# Patient Record
Sex: Male | Born: 1975 | Race: White | Hispanic: No | Marital: Single | Smoking: Former smoker
Health system: Southern US, Community
[De-identification: ages and names within clinical notes are randomized; demographics above are authoritative.]

## PROBLEM LIST (undated history)

## (undated) DIAGNOSIS — J45909 Unspecified asthma, uncomplicated: Secondary | ICD-10-CM

## (undated) DIAGNOSIS — T7840XA Allergy, unspecified, initial encounter: Secondary | ICD-10-CM

## (undated) HISTORY — DX: Allergy, unspecified, initial encounter: T78.40XA

## (undated) HISTORY — DX: Unspecified asthma, uncomplicated: J45.909

## (undated) HISTORY — PX: HERNIA REPAIR: SHX51

---

## 2008-03-27 ENCOUNTER — Ambulatory Visit (HOSPITAL_BASED_OUTPATIENT_CLINIC_OR_DEPARTMENT_OTHER): Admission: RE | Admit: 2008-03-27 | Discharge: 2008-03-27 | Payer: Self-pay | Admitting: General Surgery

## 2010-12-10 NOTE — Op Note (Signed)
[name], Joshua Obrien             ACCOUNT NO.:  [id number]   MEDICAL RECORD NO.:  [id number]          PATIENT TYPE:  AMB   LOCATION:  DSC                          FACILITY:  [facility]   PHYSICIAN:  Cherylynn Ridges, M.D.    DATE OF BIRTH:  1975/12/01   DATE OF PROCEDURE:  03/27/2008  DATE OF DISCHARGE:                               OPERATIVE REPORT   PREOPERATIVE DIAGNOSIS:  Right inguinal hernia.   POSTOPERATIVE DIAGNOSIS:  Direct right inguinal hernia.   PROCEDURE:  Right inguinal hernia repair with mesh.   SURGEON:  Marta Lamas. Lindie Spruce, MD   ANESTHESIA:  General endotracheal.   ESTIMATED BLOOD LOSS:  Less than 20 mL.   COMPLICATIONS:  None.   CONDITION:  Stable.   FINDINGS:  Large direct inguinal hernia.  No indirect component.   INDICATIONS FOR OPERATION:  The patient is a 35 year old who comes in  with a symptomatic right inguinal hernia.   OPERATION:  The patient was taken to the operating room and placed on  table in a supine position.  After an adequate general endotracheal  anesthetic was administered, he was prepped and draped in the usual  sterile manner exposing the right inguinal area.   A 6-cm long transverse curvilinear incision was made at the level of  superficial ring and taken down into the subcutaneous tissue.  We  subsequently dissected down to the external oblique fascia at the level  of the superficial ring exposing the ring itself.  We opened the  external oblique fascia along its fibers down through the superficial  ring exposing and then mobilized the spermatic cord at the pubic  tubercle.  We encircled the spermatic cord with a Penrose drain and we  could immediately see a large direct component coming out medial to the  cord.  We separated the cord from the hernia sac itself, then as we  retracted the spermatic cord laterally we dissected out the hernia sac  medially down to its base.  We inspected the spermatic cord for an  indirect sac and none was  noted towards the internal ring.   We then imbricated the hernia sac on itself using interrupted 0 Ethibond  sutures.  We then placed an oval piece of mesh measuring 3 x 5 cm in  size tacking it to the conjoined tendon anteromedially and the reflected  portion on the inguinal ligament inferolaterally.  This was done with  running 0 Prolene sutures.  Once this was done, we irrigated with  antibiotic solution in which the mesh had been soaked prior to  implantation.  We allowed the spermatic cord to fall back into the canal  and then closed the external oblique fascia on top of the cord using  running 3-0 Vicryl suture.  Marcaine 0.5% without epinephrine was  injected to the wound with total of 19 mL were used.  We then  closed Scarpa fascia using interrupted 3-0 Vicryl and then the skin was  closed using a running subcuticular stitch of 4-0 Monocryl.  Steri-  Strips, Dermabond, and Tegaderm were applied as final dressing.  All  counts  were correct including needles, sponges, and instruments.      Cherylynn Ridges, M.D.  Electronically Signed     JOW/MEDQ  D:  03/27/2008  T:  03/28/2008  Job:  [id number]   cc:   Primary Care Physician

## 2012-06-17 ENCOUNTER — Ambulatory Visit (INDEPENDENT_AMBULATORY_CARE_PROVIDER_SITE_OTHER): Payer: BC Managed Care – PPO | Admitting: Family Medicine

## 2012-06-17 VITALS — BP 123/87 | HR 81 | Temp 97.8°F | Resp 17 | Ht 72.0 in | Wt 217.0 lb

## 2012-06-17 DIAGNOSIS — R1031 Right lower quadrant pain: Secondary | ICD-10-CM

## 2012-06-17 DIAGNOSIS — IMO0001 Reserved for inherently not codable concepts without codable children: Secondary | ICD-10-CM

## 2012-06-17 DIAGNOSIS — R590 Localized enlarged lymph nodes: Secondary | ICD-10-CM

## 2012-06-17 DIAGNOSIS — R599 Enlarged lymph nodes, unspecified: Secondary | ICD-10-CM

## 2012-06-17 LAB — POCT UA - MICROSCOPIC ONLY: Crystals, Ur, HPF, POC: NEGATIVE

## 2012-06-17 LAB — POCT URINALYSIS DIPSTICK
Blood, UA: NEGATIVE
Glucose, UA: NEGATIVE
Nitrite, UA: NEGATIVE
Urobilinogen, UA: 0.2

## 2012-06-17 MED ORDER — AMOXICILLIN-POT CLAVULANATE 875-125 MG PO TABS: 1.0000 | ORAL_TABLET | Freq: Two times a day (BID) | ORAL | Status: DC

## 2012-06-17 NOTE — Progress Notes (Signed)
[address]   [place], [place]  [address]   [phone]  Subjective:    Patient ID: Joshua Obrien, male    DOB: May 02, 1976, 36 y.o.   MRN: [id number]  HPIThis 36 y.o. male presents for evaluation of R groin pain.  Awoke yesterday with sharp pain in R groin.  Feels swollen lymph node.  Pain is constant discomfort.  No skin lesions/scratches.  No fever/chills/sweats.   No myalgias.  R knee hurting today; no swelling; no radness.  No bites; small cat scratches which is normal.  No genital lesions.  No dysuria; no penile lesions.  No genital rashes.   No night sweats.  No tick bites.  Bartender.    Total partners =30+; no STDs.  Last STD check one year ago.   Old rash R medial calf present x 2 weeks.   PCP:  Deboraha Sprang [facility]     Review of Systems  Constitutional: Negative for fever, chills, diaphoresis, activity change and fatigue.  Gastrointestinal: Negative for nausea, vomiting, abdominal pain and diarrhea.  Genitourinary: Negative for dysuria, urgency, frequency, hematuria, flank pain, discharge, penile swelling, scrotal swelling, genital sores, penile pain and testicular pain.  Musculoskeletal: Negative for myalgias.  Skin: Positive for rash and wound. Negative for color change and pallor.        Past Medical History  Diagnosis Date  . Allergy   . Asthma     Past Surgical History  Procedure Date  . Hernia repair     Right    Prior to Admission medications   Medication Sig Start Date End Date Taking? Authorizing Provider  albuterol (PROVENTIL) (2.5 MG/3ML) 0.083% nebulizer solution Take 2.5 mg by nebulization every 6 (six) hours as needed.   Yes Historical Provider, MD  fluticasone-salmeterol (ADVAIR HFA) 230-21 MCG/ACT inhaler Inhale 2 puffs into the lungs 2 (two) times daily.   Yes Historical Provider, MD  amoxicillin-clavulanate (AUGMENTIN) 875-125 MG per tablet Take 1 tablet by mouth 2 (two) times daily. 06/17/12   Ethelda Chick, MD    Allergies  Allergen Reactions  .  Keflex (Cephalexin) Itching    History   Social History  . Marital Status: Single    Spouse Name: N/A    Number of Children: N/A  . Years of Education: N/A   Occupational History  . Not on file.   Social History Main Topics  . Smoking status: Current Every Day Smoker -- 1.0 packs/day for 8 years    Types: Cigarettes  . Smokeless tobacco: Not on file  . Alcohol Use: 2.4 oz/week    4 Cans of beer per week  . Drug Use: No  . Sexually Active: Yes    Birth Control/ Protection: None   Other Topics Concern  . Not on file   Social History Narrative   Marital status: single; not dating   Lives: alone  Children: none   Employment: bartender    Family History  Problem Relation Age of Onset  . Diabetes Mother   . Cirrhosis Mother   . Hemochromatosis Father     Objective:   Physical Exam  Nursing note and vitals reviewed. Constitutional: He is oriented to person, place, and time. He appears well-developed and well-nourished. No distress.  Cardiovascular: Normal rate, regular rhythm and intact distal pulses.  Exam reveals no gallop and no friction rub.   No murmur heard. Pulmonary/Chest: Effort normal and breath sounds normal. He has no wheezes. He has no rales.  Abdominal: Soft. Bowel sounds are  normal. There is no tenderness. There is no rebound and no guarding. Hernia confirmed negative in the right inguinal area and confirmed negative in the left inguinal area.  Genitourinary: Penis normal. Right testis shows no mass, no swelling and no tenderness. Left testis shows no mass, no swelling and no tenderness.  Musculoskeletal:       Right knee: He exhibits normal range of motion, no swelling, no effusion and no erythema. no tenderness found. No medial joint line, no lateral joint line and no patellar tendon tenderness noted.  Lymphadenopathy:       Right: Inguinal adenopathy present.  Neurological: He is alert and oriented to person, place, and time.  Skin: He is not  diaphoretic.       Scattered linear scratches distal calf RLE without erythema, drainage, induration.  Psychiatric: He has a normal mood and affect. His behavior is normal. Thought content normal.        Results for orders placed in visit on 06/17/12  POCT URINALYSIS DIPSTICK      Component Value Range   Color, UA amber     Clarity, UA clear     Glucose, UA neg     Bilirubin, UA neg     Ketones, UA neg     Spec Grav, UA 1.025     Blood, UA neg     pH, UA 6.5     Protein, UA neg     Urobilinogen, UA 0.2     Nitrite, UA neg     Leukocytes, UA Negative    POCT UA - MICROSCOPIC ONLY      Component Value Range   WBC, Ur, HPF, POC 2-4     RBC, urine, microscopic 2-4     Bacteria, U Microscopic trace     Mucus, UA small     Epithelial cells, urine per micros 1-3     Crystals, Ur, HPF, POC neg     Casts, Ur, LPF, POC neg     Yeast, UA neg      Assessment & Plan:   1. Groin pain, right lower quadrant  POCT urinalysis dipstick, POCT UA - Microscopic Only, Urine culture, HIV antibody, GC/chlamydia probe amp, urine, RPR  2. Lymphadenopathy, inguinal  amoxicillin-clavulanate (AUGMENTIN) 875-125 MG per tablet    1. R groin pain: New.  Secondary to LAD.  Supportive care with rest, fluids, Ibuprofen.  RTC if no improvement in 2-3 weeks. 2. R inguinal LAD:  New.  Obtain urine for GC/Chlam, urine culture, HIV, RPR.  Treat empirically with Augmentin 875mg  bid.  RTC if not completely resolved in 2-3 weeks.  Meds ordered this encounter  Medications  . fluticasone-salmeterol (ADVAIR HFA) 230-21 MCG/ACT inhaler    Sig: Inhale 2 puffs into the lungs 2 (two) times daily.  Marland Kitchen albuterol (PROVENTIL) (2.5 MG/3ML) 0.083% nebulizer solution    Sig: Take 2.5 mg by nebulization every 6 (six) hours as needed.  Marland Kitchen amoxicillin-clavulanate (AUGMENTIN) 875-125 MG per tablet    Sig: Take 1 tablet by mouth 2 (two) times daily.    Dispense:  20 tablet    Refill:  0

## 2012-06-17 NOTE — Patient Instructions (Addendum)
1. Groin pain, right lower quadrant  POCT urinalysis dipstick, POCT UA - Microscopic Only, Urine culture, HIV antibody, GC/chlamydia probe amp, urine, RPR  2. Lymphadenopathy, inguinal  amoxicillin-clavulanate (AUGMENTIN) 875-125 MG per tablet      RETURN IN THREE WEEKS IF NO IMPROVEMENT.

## 2012-06-19 LAB — URINE CULTURE
Colony Count: NO GROWTH
Organism ID, Bacteria: NO GROWTH

## 2012-06-19 LAB — GC/CHLAMYDIA PROBE AMP, URINE: GC Probe Amp, Urine: NEGATIVE

## 2014-02-12 ENCOUNTER — Ambulatory Visit (INDEPENDENT_AMBULATORY_CARE_PROVIDER_SITE_OTHER): Payer: BC Managed Care – PPO | Admitting: Family Medicine

## 2014-02-12 VITALS — BP 110/80 | HR 71 | Temp 97.8°F | Resp 20 | Ht 71.5 in | Wt 219.2 lb

## 2014-02-12 DIAGNOSIS — M545 Low back pain, unspecified: Secondary | ICD-10-CM

## 2014-02-12 MED ORDER — PREDNISONE 20 MG PO TABS: ORAL_TABLET | ORAL | Status: DC

## 2014-02-12 MED ORDER — TRAMADOL HCL 50 MG PO TABS: 50.0000 mg | ORAL_TABLET | Freq: Three times a day (TID) | ORAL | Status: DC | PRN

## 2014-02-12 NOTE — Progress Notes (Signed)
This chart was scribed for Elvina SidleKurt Lauenstein, MD by Ardelia Memsylan Malpass, Scribe. This patient was seen in room 10 and the patient's care was started at 1:51 PM.  @UMFCLOGO @  Patient ID: Joshua Obrien MRN: [id number], DOB: [birth year], 38 y.o. Date of Encounter: 02/12/2014, 1:58 PM  Primary Physician: No primary provider on file.  Chief Complaint: Back Pain  HPI: 38 y.o. year old male with history below presents with gradually worsening lower back pain onset 3 days ago. He states that his pain began Friday morning as soreness, and gradually worsened throughout the day. He states that over the past 2 days his pain has felt like "tightness". He suspects that he pulled a muscle in his back on Thursday, although he does not recall any specific injury. He states that he has been taking Ibuprofen with some relief. He states that he had difficulty sleeping last night due to pain. He reports that he has had similar back pain on one occasion in the past. He reports that he does not want any strong narcotics or muscle relaxants.    Past Medical History  Diagnosis Date   Allergy    Asthma      Home Meds: Prior to Admission medications   Medication Sig Start Date End Date Taking? Authorizing Provider  albuterol (PROVENTIL) (2.5 MG/3ML) 0.083% nebulizer solution Take 2.5 mg by nebulization every 6 (six) hours as needed.   Yes Historical Provider, MD  fluticasone-salmeterol (ADVAIR HFA) 230-21 MCG/ACT inhaler Inhale 2 puffs into the lungs 2 (two) times daily.   Yes Historical Provider, MD    Allergies:  Allergies  Allergen Reactions   Keflex [Cephalexin] Itching    History   Social History   Marital Status: Single    Spouse Name: N/A    Number of Children: N/A   Years of Education: N/A   Occupational History   Not on file.   Social History Main Topics   Smoking status: Current Every Day Smoker -- 1.00 packs/day for 8 years    Types: Cigarettes   Smokeless tobacco: Not on file    Alcohol Use: 2.4 oz/week    4 Cans of beer per week   Drug Use: No   Sexual Activity: Yes    CopyBirth Control/ Protection: None   Other Topics Concern   Not on file   Social History Narrative   Marital status: single; not dating      Lives: alone     Children: none      Employment: bartender           Review of Systems: Constitutional: negative for chills, fever, night sweats, weight changes, or fatigue  HEENT: negative for vision changes, hearing loss, congestion, rhinorrhea, ST, epistaxis, or sinus pressure Cardiovascular: negative for chest pain or palpitations Respiratory: negative for hemoptysis, wheezing, shortness of breath, or cough Abdominal: negative for abdominal pain, nausea, vomiting, diarrhea, or constipation Dermatological: negative for rash Neurologic: negative for headache, dizziness, or syncope All other systems reviewed and are otherwise negative with the exception to those above and in the HPI.   Physical Exam: Blood pressure 110/80, pulse 71, temperature 97.8 F (36.6 C), temperature source Oral, resp. rate 20, height 5' 11.5" (1.816 m), weight 219 lb 3.2 oz (99.428 kg), SpO2 98.00%., Body mass index is 30.15 kg/(m^2). General: Well developed, well nourished, in no acute distress. Head: Normocephalic, atraumatic, eyes without discharge, sclera non-icteric, nares are without discharge. Bilateral auditory canals clear, TM's are without perforation, pearly grey and translucent with reflective  cone of light bilaterally. Oral cavity moist, posterior pharynx without exudate, erythema, peritonsillar abscess, or post nasal drip.  Neck: Supple. No thyromegaly. Full ROM. No lymphadenopathy. Lungs: Clear bilaterally to auscultation without wheezes, rales, or rhonchi. Breathing is unlabored. Heart: RRR with S1 S2. No murmurs, rubs, or gallops appreciated. Abdomen: Soft, non-tender, non-distended with normoactive bowel sounds. No hepatomegaly. No rebound/guarding. No  obvious abdominal masses. Msk:  Strength and tone normal for age. Extremities/Skin: Warm and dry. No clubbing or cyanosis. No edema. No rashes or suspicious lesions. Neuro: Alert and oriented X 3. Moves all extremities spontaneously. Gait is normal. CNII-XII grossly in tact.  Positive SLR left side with either leg extended.  DTR's intact and symmetric Psych:  Responds to questions appropriately with a normal affect.   Labs:   ASSESSMENT AND PLAN:  38 y.o. year old male with Left-sided low back pain without sciatica - Plan: predniSONE (DELTASONE) 20 MG tablet, traMADol (ULTRAM) 50 MG tablet     Signed, Elvina Sidle, MD 02/12/2014 1:58 PM

## 2014-02-12 NOTE — Patient Instructions (Signed)

## 2015-11-13 DIAGNOSIS — S60511A Abrasion of right hand, initial encounter: Secondary | ICD-10-CM | POA: Diagnosis not present

## 2015-11-13 DIAGNOSIS — L03113 Cellulitis of right upper limb: Secondary | ICD-10-CM | POA: Diagnosis not present

## 2015-11-13 DIAGNOSIS — S61431A Puncture wound without foreign body of right hand, initial encounter: Secondary | ICD-10-CM | POA: Diagnosis not present

## 2016-04-21 DIAGNOSIS — R55 Syncope and collapse: Secondary | ICD-10-CM | POA: Diagnosis not present

## 2016-04-29 ENCOUNTER — Encounter: Payer: Self-pay | Admitting: Internal Medicine

## 2016-04-29 ENCOUNTER — Ambulatory Visit (INDEPENDENT_AMBULATORY_CARE_PROVIDER_SITE_OTHER): Payer: BLUE CROSS/BLUE SHIELD | Admitting: Internal Medicine

## 2016-04-29 VITALS — BP 122/86 | HR 72 | Ht 72.0 in | Wt 236.8 lb

## 2016-04-29 DIAGNOSIS — R55 Syncope and collapse: Secondary | ICD-10-CM

## 2016-04-29 NOTE — Progress Notes (Signed)
HPI Joshua Obrien is referred today for evaluation of syncope. He is an otherwise healthy 40 yo man who first had syncope about a year ago when he was at a party. He noticed cold sweats, nausea and felt bad. He first sat down then got up to go to the bathroom and passed out. No tongue biting and no loss of bowel or bladder function. On awakening he felt bad but eventually improved. 2 weeks ago he nearly passed out again. This time he had gone out for a bike ride and then went to the [employer] [address] [employer]. While at the counter, he again began to feel bad, started to sweat and tried splashing water on his face. He felt like he was going to pass out and laid down on the floor in the restaurant and put his feet up. He managed to avoid passing out and eventually recovered and felt better. He has no chest pain or sob. He is active and otherwise has no symptoms. No significant past medical history except for asthma for which he has been controlled with bronchodilators.  Allergies  Allergen Reactions  . Keflex [Cephalexin] Itching     Current Outpatient Prescriptions  Medication Sig Dispense Refill  . albuterol (PROVENTIL) (2.5 MG/3ML) 0.083% nebulizer solution Take 2.5 mg by nebulization every 6 (six) hours as needed.    . fluticasone-salmeterol (ADVAIR HFA) 230-21 MCG/ACT inhaler Inhale 2 puffs into the lungs 2 (two) times daily.     No current facility-administered medications for this visit.      Past Medical History:  Diagnosis Date  . Allergy   . Asthma     ROS:   All systems reviewed and negative except as noted in the HPI.   Past Surgical History:  Procedure Laterality Date  . HERNIA REPAIR     Right     Family History  Problem Relation Age of Onset  . Diabetes Mother   . Cirrhosis Mother   . Hemochromatosis Father      Social History   Social History  . Marital status: Single    Spouse name: N/A  . Number of children: N/A  . Years of education: N/A    Occupational History  . Not on file.   Social History Main Topics  . Smoking status: Former Smoker    Packs/day: 1.00    Years: 8.00    Types: Cigarettes    Quit date: 06/30/2015  . Smokeless tobacco: Never Used  . Alcohol use 2.4 oz/week    4 Cans of beer per week  . Drug use: No  . Sexual activity: Yes    Birth control/ protection: None   Other Topics Concern  . Not on file   Social History Narrative   Marital status: single; not dating      Lives: alone     Children: none      Employment: bartender           BP 122/86   Pulse 72   Ht 6' (1.829 m)   Wt 236 lb 12.8 oz (107.4 kg)   SpO2 98%   BMI 32.12 kg/m   Physical Exam:  Well appearing 40 yo man, NAD HEENT: Unremarkable Neck:  6 cm JVD, no thyromegally Lymphatics:  No adenopathy Back:  No CVA tenderness Lungs:  Clear with no wheezes HEART:  Regular rate rhythm, no murmurs, no rubs, no clicks Abd:  soft, positive bowel sounds, no organomegally, no rebound, no guarding Ext:  2 plus pulses, no edema, no cyanosis, no clubbing Skin:  No rashes no nodules Neuro:  CN II through XII intact, motor grossly intact  EKG - reviewed - NSR  Assess/Plan: 1. Syncope - I have discussed the pathophysiology of autonomic dysfunction leading to neurally mediate syncope. The importance of recognizing the onset of his symptoms, increasing his salt and fluid intake and lying down when he feels a spell coming on were all reviewed. I would favor he allow his blood pressure to run a little high. At this point, I would not recommend he undergo any additional testing unless his symptoms change. 2. Asthma - his symptoms appear to be well controlled.

## 2016-04-29 NOTE — Patient Instructions (Signed)
Medication Instructions:  Your physician recommends that you continue on your current medications as directed. Please refer to the Current Medication list given to you today.   Labwork: None ordered   Testing/Procedures: None ordered   Follow-Up: Your physician recommends that you schedule a follow-up appointment in: 3 months with Dr Taylor   Any Other Special Instructions Will Be Listed Below (If Applicable).     If you need a refill on your cardiac medications before your next appointment, please call your pharmacy.   

## 2016-06-23 DIAGNOSIS — J45909 Unspecified asthma, uncomplicated: Secondary | ICD-10-CM | POA: Diagnosis not present

## 2016-06-23 DIAGNOSIS — R55 Syncope and collapse: Secondary | ICD-10-CM | POA: Diagnosis not present

## 2016-07-30 ENCOUNTER — Ambulatory Visit: Payer: BLUE CROSS/BLUE SHIELD | Admitting: Internal Medicine

## 2016-08-08 DIAGNOSIS — H5213 Myopia, bilateral: Secondary | ICD-10-CM | POA: Diagnosis not present

## 2016-09-30 DIAGNOSIS — R202 Paresthesia of skin: Secondary | ICD-10-CM | POA: Diagnosis not present

## 2016-09-30 DIAGNOSIS — J45909 Unspecified asthma, uncomplicated: Secondary | ICD-10-CM | POA: Diagnosis not present

## 2016-09-30 DIAGNOSIS — R0981 Nasal congestion: Secondary | ICD-10-CM | POA: Diagnosis not present

## 2016-09-30 DIAGNOSIS — Z13220 Encounter for screening for lipoid disorders: Secondary | ICD-10-CM | POA: Diagnosis not present

## 2016-09-30 DIAGNOSIS — Z0000 Encounter for general adult medical examination without abnormal findings: Secondary | ICD-10-CM | POA: Diagnosis not present

## 2016-09-30 DIAGNOSIS — Z113 Encounter for screening for infections with a predominantly sexual mode of transmission: Secondary | ICD-10-CM | POA: Diagnosis not present

## 2017-04-10 DIAGNOSIS — Z113 Encounter for screening for infections with a predominantly sexual mode of transmission: Secondary | ICD-10-CM | POA: Diagnosis not present

## 2017-04-10 DIAGNOSIS — S43421A Sprain of right rotator cuff capsule, initial encounter: Secondary | ICD-10-CM | POA: Diagnosis not present

## 2017-04-24 DIAGNOSIS — M7541 Impingement syndrome of right shoulder: Secondary | ICD-10-CM | POA: Diagnosis not present

## 2017-04-27 ENCOUNTER — Ambulatory Visit: Payer: BLUE CROSS/BLUE SHIELD

## 2017-05-01 DIAGNOSIS — M7541 Impingement syndrome of right shoulder: Secondary | ICD-10-CM | POA: Diagnosis not present

## 2017-05-04 DIAGNOSIS — M25511 Pain in right shoulder: Secondary | ICD-10-CM | POA: Diagnosis not present

## 2017-05-07 DIAGNOSIS — M25511 Pain in right shoulder: Secondary | ICD-10-CM | POA: Diagnosis not present

## 2017-05-20 DIAGNOSIS — S46011A Strain of muscle(s) and tendon(s) of the rotator cuff of right shoulder, initial encounter: Secondary | ICD-10-CM | POA: Diagnosis not present

## 2017-05-20 DIAGNOSIS — S43491A Other sprain of right shoulder joint, initial encounter: Secondary | ICD-10-CM | POA: Diagnosis not present

## 2017-05-20 DIAGNOSIS — M24111 Other articular cartilage disorders, right shoulder: Secondary | ICD-10-CM | POA: Diagnosis not present

## 2017-05-20 DIAGNOSIS — M7541 Impingement syndrome of right shoulder: Secondary | ICD-10-CM | POA: Diagnosis not present

## 2017-05-20 DIAGNOSIS — S46291A Other injury of muscle, fascia and tendon of other parts of biceps, right arm, initial encounter: Secondary | ICD-10-CM | POA: Diagnosis not present

## 2017-05-20 DIAGNOSIS — G8918 Other acute postprocedural pain: Secondary | ICD-10-CM | POA: Diagnosis not present

## 2017-05-20 DIAGNOSIS — M75121 Complete rotator cuff tear or rupture of right shoulder, not specified as traumatic: Secondary | ICD-10-CM | POA: Diagnosis not present

## 2017-05-20 DIAGNOSIS — M7551 Bursitis of right shoulder: Secondary | ICD-10-CM | POA: Diagnosis not present

## 2017-05-20 DIAGNOSIS — M7521 Bicipital tendinitis, right shoulder: Secondary | ICD-10-CM | POA: Diagnosis not present

## 2017-05-25 DIAGNOSIS — M25511 Pain in right shoulder: Secondary | ICD-10-CM | POA: Diagnosis not present

## 2017-05-25 DIAGNOSIS — M7521 Bicipital tendinitis, right shoulder: Secondary | ICD-10-CM | POA: Diagnosis not present

## 2017-05-25 DIAGNOSIS — M6281 Muscle weakness (generalized): Secondary | ICD-10-CM | POA: Diagnosis not present

## 2017-05-25 DIAGNOSIS — M75121 Complete rotator cuff tear or rupture of right shoulder, not specified as traumatic: Secondary | ICD-10-CM | POA: Diagnosis not present

## 2017-05-28 DIAGNOSIS — M25511 Pain in right shoulder: Secondary | ICD-10-CM | POA: Diagnosis not present

## 2017-05-29 DIAGNOSIS — M6281 Muscle weakness (generalized): Secondary | ICD-10-CM | POA: Diagnosis not present

## 2017-05-29 DIAGNOSIS — M7521 Bicipital tendinitis, right shoulder: Secondary | ICD-10-CM | POA: Diagnosis not present

## 2017-05-29 DIAGNOSIS — M75121 Complete rotator cuff tear or rupture of right shoulder, not specified as traumatic: Secondary | ICD-10-CM | POA: Diagnosis not present

## 2017-05-29 DIAGNOSIS — M25511 Pain in right shoulder: Secondary | ICD-10-CM | POA: Diagnosis not present

## 2017-06-01 DIAGNOSIS — M75121 Complete rotator cuff tear or rupture of right shoulder, not specified as traumatic: Secondary | ICD-10-CM | POA: Diagnosis not present

## 2017-06-01 DIAGNOSIS — M25511 Pain in right shoulder: Secondary | ICD-10-CM | POA: Diagnosis not present

## 2017-06-01 DIAGNOSIS — M6281 Muscle weakness (generalized): Secondary | ICD-10-CM | POA: Diagnosis not present

## 2017-06-01 DIAGNOSIS — M7521 Bicipital tendinitis, right shoulder: Secondary | ICD-10-CM | POA: Diagnosis not present

## 2017-06-04 DIAGNOSIS — M25511 Pain in right shoulder: Secondary | ICD-10-CM | POA: Diagnosis not present

## 2017-06-04 DIAGNOSIS — M7521 Bicipital tendinitis, right shoulder: Secondary | ICD-10-CM | POA: Diagnosis not present

## 2017-06-04 DIAGNOSIS — M6281 Muscle weakness (generalized): Secondary | ICD-10-CM | POA: Diagnosis not present

## 2017-06-04 DIAGNOSIS — M75121 Complete rotator cuff tear or rupture of right shoulder, not specified as traumatic: Secondary | ICD-10-CM | POA: Diagnosis not present

## 2017-06-08 DIAGNOSIS — M25511 Pain in right shoulder: Secondary | ICD-10-CM | POA: Diagnosis not present

## 2017-06-08 DIAGNOSIS — M6281 Muscle weakness (generalized): Secondary | ICD-10-CM | POA: Diagnosis not present

## 2017-06-08 DIAGNOSIS — M75121 Complete rotator cuff tear or rupture of right shoulder, not specified as traumatic: Secondary | ICD-10-CM | POA: Diagnosis not present

## 2017-06-08 DIAGNOSIS — M7521 Bicipital tendinitis, right shoulder: Secondary | ICD-10-CM | POA: Diagnosis not present

## 2017-06-11 DIAGNOSIS — M6281 Muscle weakness (generalized): Secondary | ICD-10-CM | POA: Diagnosis not present

## 2017-06-11 DIAGNOSIS — M75121 Complete rotator cuff tear or rupture of right shoulder, not specified as traumatic: Secondary | ICD-10-CM | POA: Diagnosis not present

## 2017-06-11 DIAGNOSIS — M25511 Pain in right shoulder: Secondary | ICD-10-CM | POA: Diagnosis not present

## 2017-06-11 DIAGNOSIS — M7521 Bicipital tendinitis, right shoulder: Secondary | ICD-10-CM | POA: Diagnosis not present

## 2017-06-16 DIAGNOSIS — M75121 Complete rotator cuff tear or rupture of right shoulder, not specified as traumatic: Secondary | ICD-10-CM | POA: Diagnosis not present

## 2017-06-16 DIAGNOSIS — M7521 Bicipital tendinitis, right shoulder: Secondary | ICD-10-CM | POA: Diagnosis not present

## 2017-06-16 DIAGNOSIS — M6281 Muscle weakness (generalized): Secondary | ICD-10-CM | POA: Diagnosis not present

## 2017-06-16 DIAGNOSIS — M25511 Pain in right shoulder: Secondary | ICD-10-CM | POA: Diagnosis not present

## 2017-06-22 DIAGNOSIS — M6281 Muscle weakness (generalized): Secondary | ICD-10-CM | POA: Diagnosis not present

## 2017-06-22 DIAGNOSIS — M25511 Pain in right shoulder: Secondary | ICD-10-CM | POA: Diagnosis not present

## 2017-06-22 DIAGNOSIS — M75121 Complete rotator cuff tear or rupture of right shoulder, not specified as traumatic: Secondary | ICD-10-CM | POA: Diagnosis not present

## 2017-06-22 DIAGNOSIS — M7521 Bicipital tendinitis, right shoulder: Secondary | ICD-10-CM | POA: Diagnosis not present

## 2017-06-24 DIAGNOSIS — M6281 Muscle weakness (generalized): Secondary | ICD-10-CM | POA: Diagnosis not present

## 2017-06-24 DIAGNOSIS — M75121 Complete rotator cuff tear or rupture of right shoulder, not specified as traumatic: Secondary | ICD-10-CM | POA: Diagnosis not present

## 2017-06-24 DIAGNOSIS — M7521 Bicipital tendinitis, right shoulder: Secondary | ICD-10-CM | POA: Diagnosis not present

## 2017-06-24 DIAGNOSIS — M25511 Pain in right shoulder: Secondary | ICD-10-CM | POA: Diagnosis not present

## 2017-06-29 DIAGNOSIS — M6281 Muscle weakness (generalized): Secondary | ICD-10-CM | POA: Diagnosis not present

## 2017-06-29 DIAGNOSIS — M75121 Complete rotator cuff tear or rupture of right shoulder, not specified as traumatic: Secondary | ICD-10-CM | POA: Diagnosis not present

## 2017-06-29 DIAGNOSIS — M7521 Bicipital tendinitis, right shoulder: Secondary | ICD-10-CM | POA: Diagnosis not present

## 2017-06-29 DIAGNOSIS — M25511 Pain in right shoulder: Secondary | ICD-10-CM | POA: Diagnosis not present

## 2017-07-02 DIAGNOSIS — M75121 Complete rotator cuff tear or rupture of right shoulder, not specified as traumatic: Secondary | ICD-10-CM | POA: Diagnosis not present

## 2017-07-02 DIAGNOSIS — M25511 Pain in right shoulder: Secondary | ICD-10-CM | POA: Diagnosis not present

## 2017-07-02 DIAGNOSIS — M6281 Muscle weakness (generalized): Secondary | ICD-10-CM | POA: Diagnosis not present

## 2017-07-02 DIAGNOSIS — M7521 Bicipital tendinitis, right shoulder: Secondary | ICD-10-CM | POA: Diagnosis not present

## 2017-07-09 DIAGNOSIS — M75121 Complete rotator cuff tear or rupture of right shoulder, not specified as traumatic: Secondary | ICD-10-CM | POA: Diagnosis not present

## 2017-07-09 DIAGNOSIS — M6281 Muscle weakness (generalized): Secondary | ICD-10-CM | POA: Diagnosis not present

## 2017-07-09 DIAGNOSIS — M7521 Bicipital tendinitis, right shoulder: Secondary | ICD-10-CM | POA: Diagnosis not present

## 2017-07-09 DIAGNOSIS — M25511 Pain in right shoulder: Secondary | ICD-10-CM | POA: Diagnosis not present

## 2017-07-13 DIAGNOSIS — M6281 Muscle weakness (generalized): Secondary | ICD-10-CM | POA: Diagnosis not present

## 2017-07-13 DIAGNOSIS — M75121 Complete rotator cuff tear or rupture of right shoulder, not specified as traumatic: Secondary | ICD-10-CM | POA: Diagnosis not present

## 2017-07-13 DIAGNOSIS — M25511 Pain in right shoulder: Secondary | ICD-10-CM | POA: Diagnosis not present

## 2017-07-13 DIAGNOSIS — M7521 Bicipital tendinitis, right shoulder: Secondary | ICD-10-CM | POA: Diagnosis not present

## 2017-07-16 DIAGNOSIS — M25511 Pain in right shoulder: Secondary | ICD-10-CM | POA: Diagnosis not present

## 2017-07-16 DIAGNOSIS — M7521 Bicipital tendinitis, right shoulder: Secondary | ICD-10-CM | POA: Diagnosis not present

## 2017-07-16 DIAGNOSIS — M6281 Muscle weakness (generalized): Secondary | ICD-10-CM | POA: Diagnosis not present

## 2017-07-16 DIAGNOSIS — M75121 Complete rotator cuff tear or rupture of right shoulder, not specified as traumatic: Secondary | ICD-10-CM | POA: Diagnosis not present

## 2017-07-23 DIAGNOSIS — M75121 Complete rotator cuff tear or rupture of right shoulder, not specified as traumatic: Secondary | ICD-10-CM | POA: Diagnosis not present

## 2017-07-23 DIAGNOSIS — M6281 Muscle weakness (generalized): Secondary | ICD-10-CM | POA: Diagnosis not present

## 2017-07-23 DIAGNOSIS — M25511 Pain in right shoulder: Secondary | ICD-10-CM | POA: Diagnosis not present

## 2017-07-23 DIAGNOSIS — M7521 Bicipital tendinitis, right shoulder: Secondary | ICD-10-CM | POA: Diagnosis not present

## 2017-07-27 DIAGNOSIS — M25511 Pain in right shoulder: Secondary | ICD-10-CM | POA: Diagnosis not present

## 2017-07-27 DIAGNOSIS — M6281 Muscle weakness (generalized): Secondary | ICD-10-CM | POA: Diagnosis not present

## 2017-07-27 DIAGNOSIS — M75121 Complete rotator cuff tear or rupture of right shoulder, not specified as traumatic: Secondary | ICD-10-CM | POA: Diagnosis not present

## 2017-07-27 DIAGNOSIS — M7521 Bicipital tendinitis, right shoulder: Secondary | ICD-10-CM | POA: Diagnosis not present

## 2017-07-30 DIAGNOSIS — M7521 Bicipital tendinitis, right shoulder: Secondary | ICD-10-CM | POA: Diagnosis not present

## 2017-07-30 DIAGNOSIS — M25511 Pain in right shoulder: Secondary | ICD-10-CM | POA: Diagnosis not present

## 2017-07-30 DIAGNOSIS — M75121 Complete rotator cuff tear or rupture of right shoulder, not specified as traumatic: Secondary | ICD-10-CM | POA: Diagnosis not present

## 2017-07-30 DIAGNOSIS — M6281 Muscle weakness (generalized): Secondary | ICD-10-CM | POA: Diagnosis not present

## 2017-08-03 DIAGNOSIS — M25511 Pain in right shoulder: Secondary | ICD-10-CM | POA: Diagnosis not present

## 2017-08-03 DIAGNOSIS — M7521 Bicipital tendinitis, right shoulder: Secondary | ICD-10-CM | POA: Diagnosis not present

## 2017-08-03 DIAGNOSIS — M75121 Complete rotator cuff tear or rupture of right shoulder, not specified as traumatic: Secondary | ICD-10-CM | POA: Diagnosis not present

## 2017-08-03 DIAGNOSIS — M6281 Muscle weakness (generalized): Secondary | ICD-10-CM | POA: Diagnosis not present

## 2017-08-06 DIAGNOSIS — M7521 Bicipital tendinitis, right shoulder: Secondary | ICD-10-CM | POA: Diagnosis not present

## 2017-08-06 DIAGNOSIS — M25511 Pain in right shoulder: Secondary | ICD-10-CM | POA: Diagnosis not present

## 2017-08-06 DIAGNOSIS — M6281 Muscle weakness (generalized): Secondary | ICD-10-CM | POA: Diagnosis not present

## 2017-08-06 DIAGNOSIS — M75121 Complete rotator cuff tear or rupture of right shoulder, not specified as traumatic: Secondary | ICD-10-CM | POA: Diagnosis not present

## 2017-08-10 DIAGNOSIS — M75121 Complete rotator cuff tear or rupture of right shoulder, not specified as traumatic: Secondary | ICD-10-CM | POA: Diagnosis not present

## 2017-08-10 DIAGNOSIS — M25511 Pain in right shoulder: Secondary | ICD-10-CM | POA: Diagnosis not present

## 2017-08-10 DIAGNOSIS — M7521 Bicipital tendinitis, right shoulder: Secondary | ICD-10-CM | POA: Diagnosis not present

## 2017-08-10 DIAGNOSIS — M6281 Muscle weakness (generalized): Secondary | ICD-10-CM | POA: Diagnosis not present

## 2017-08-13 DIAGNOSIS — M75121 Complete rotator cuff tear or rupture of right shoulder, not specified as traumatic: Secondary | ICD-10-CM | POA: Diagnosis not present

## 2017-08-13 DIAGNOSIS — M6281 Muscle weakness (generalized): Secondary | ICD-10-CM | POA: Diagnosis not present

## 2017-08-13 DIAGNOSIS — M25571 Pain in right ankle and joints of right foot: Secondary | ICD-10-CM | POA: Diagnosis not present

## 2017-08-13 DIAGNOSIS — M25511 Pain in right shoulder: Secondary | ICD-10-CM | POA: Diagnosis not present

## 2017-08-13 DIAGNOSIS — M7521 Bicipital tendinitis, right shoulder: Secondary | ICD-10-CM | POA: Diagnosis not present

## 2017-08-18 DIAGNOSIS — M6281 Muscle weakness (generalized): Secondary | ICD-10-CM | POA: Diagnosis not present

## 2017-08-18 DIAGNOSIS — M25511 Pain in right shoulder: Secondary | ICD-10-CM | POA: Diagnosis not present

## 2017-08-18 DIAGNOSIS — M7521 Bicipital tendinitis, right shoulder: Secondary | ICD-10-CM | POA: Diagnosis not present

## 2017-08-18 DIAGNOSIS — M75121 Complete rotator cuff tear or rupture of right shoulder, not specified as traumatic: Secondary | ICD-10-CM | POA: Diagnosis not present

## 2017-08-20 DIAGNOSIS — M7521 Bicipital tendinitis, right shoulder: Secondary | ICD-10-CM | POA: Diagnosis not present

## 2017-08-20 DIAGNOSIS — M75121 Complete rotator cuff tear or rupture of right shoulder, not specified as traumatic: Secondary | ICD-10-CM | POA: Diagnosis not present

## 2017-08-20 DIAGNOSIS — M25511 Pain in right shoulder: Secondary | ICD-10-CM | POA: Diagnosis not present

## 2017-08-20 DIAGNOSIS — M6281 Muscle weakness (generalized): Secondary | ICD-10-CM | POA: Diagnosis not present

## 2017-08-25 DIAGNOSIS — M75121 Complete rotator cuff tear or rupture of right shoulder, not specified as traumatic: Secondary | ICD-10-CM | POA: Diagnosis not present

## 2017-08-25 DIAGNOSIS — M7521 Bicipital tendinitis, right shoulder: Secondary | ICD-10-CM | POA: Diagnosis not present

## 2017-08-25 DIAGNOSIS — M25511 Pain in right shoulder: Secondary | ICD-10-CM | POA: Diagnosis not present

## 2017-08-25 DIAGNOSIS — M6281 Muscle weakness (generalized): Secondary | ICD-10-CM | POA: Diagnosis not present

## 2017-08-27 DIAGNOSIS — M75121 Complete rotator cuff tear or rupture of right shoulder, not specified as traumatic: Secondary | ICD-10-CM | POA: Diagnosis not present

## 2017-08-27 DIAGNOSIS — M7521 Bicipital tendinitis, right shoulder: Secondary | ICD-10-CM | POA: Diagnosis not present

## 2017-08-27 DIAGNOSIS — M6281 Muscle weakness (generalized): Secondary | ICD-10-CM | POA: Diagnosis not present

## 2017-08-27 DIAGNOSIS — M25511 Pain in right shoulder: Secondary | ICD-10-CM | POA: Diagnosis not present

## 2017-09-03 DIAGNOSIS — M7521 Bicipital tendinitis, right shoulder: Secondary | ICD-10-CM | POA: Diagnosis not present

## 2017-09-03 DIAGNOSIS — M6281 Muscle weakness (generalized): Secondary | ICD-10-CM | POA: Diagnosis not present

## 2017-09-03 DIAGNOSIS — M75121 Complete rotator cuff tear or rupture of right shoulder, not specified as traumatic: Secondary | ICD-10-CM | POA: Diagnosis not present

## 2017-09-03 DIAGNOSIS — M25511 Pain in right shoulder: Secondary | ICD-10-CM | POA: Diagnosis not present

## 2017-09-08 DIAGNOSIS — M25511 Pain in right shoulder: Secondary | ICD-10-CM | POA: Diagnosis not present

## 2017-09-08 DIAGNOSIS — M75121 Complete rotator cuff tear or rupture of right shoulder, not specified as traumatic: Secondary | ICD-10-CM | POA: Diagnosis not present

## 2017-09-08 DIAGNOSIS — M6281 Muscle weakness (generalized): Secondary | ICD-10-CM | POA: Diagnosis not present

## 2017-09-08 DIAGNOSIS — M7521 Bicipital tendinitis, right shoulder: Secondary | ICD-10-CM | POA: Diagnosis not present

## 2017-09-10 DIAGNOSIS — M7521 Bicipital tendinitis, right shoulder: Secondary | ICD-10-CM | POA: Diagnosis not present

## 2017-09-10 DIAGNOSIS — M75121 Complete rotator cuff tear or rupture of right shoulder, not specified as traumatic: Secondary | ICD-10-CM | POA: Diagnosis not present

## 2017-09-10 DIAGNOSIS — M25511 Pain in right shoulder: Secondary | ICD-10-CM | POA: Diagnosis not present

## 2017-09-10 DIAGNOSIS — M6281 Muscle weakness (generalized): Secondary | ICD-10-CM | POA: Diagnosis not present

## 2018-08-09 DIAGNOSIS — J4540 Moderate persistent asthma, uncomplicated: Secondary | ICD-10-CM | POA: Diagnosis not present

## 2018-08-09 DIAGNOSIS — M25571 Pain in right ankle and joints of right foot: Secondary | ICD-10-CM | POA: Diagnosis not present

## 2018-08-09 DIAGNOSIS — N529 Male erectile dysfunction, unspecified: Secondary | ICD-10-CM | POA: Diagnosis not present

## 2018-09-20 DIAGNOSIS — M722 Plantar fascial fibromatosis: Secondary | ICD-10-CM | POA: Diagnosis not present

## 2018-12-06 DIAGNOSIS — M7581 Other shoulder lesions, right shoulder: Secondary | ICD-10-CM | POA: Diagnosis not present

## 2019-04-19 DIAGNOSIS — Z0000 Encounter for general adult medical examination without abnormal findings: Secondary | ICD-10-CM | POA: Diagnosis not present

## 2019-04-25 DIAGNOSIS — Z6831 Body mass index (BMI) 31.0-31.9, adult: Secondary | ICD-10-CM | POA: Diagnosis not present

## 2019-04-25 DIAGNOSIS — Z8349 Family history of other endocrine, nutritional and metabolic diseases: Secondary | ICD-10-CM | POA: Diagnosis not present

## 2019-04-25 DIAGNOSIS — Z0000 Encounter for general adult medical examination without abnormal findings: Secondary | ICD-10-CM | POA: Diagnosis not present

## 2019-04-25 DIAGNOSIS — R200 Anesthesia of skin: Secondary | ICD-10-CM | POA: Diagnosis not present

## 2019-04-25 DIAGNOSIS — Z23 Encounter for immunization: Secondary | ICD-10-CM | POA: Diagnosis not present

## 2019-10-13 ENCOUNTER — Ambulatory Visit: Payer: Self-pay | Attending: Internal Medicine

## 2019-10-13 DIAGNOSIS — Z23 Encounter for immunization: Secondary | ICD-10-CM

## 2019-10-13 NOTE — Progress Notes (Signed)
   Covid-19 Vaccination Clinic  Name:  Joshua Obrien    MRN: [id number] DOB: August 28, 1975  10/13/2019  Joshua Obrien was observed post Covid-19 immunization for 15 minutes without incident. He was provided with Vaccine Information Sheet and instruction to access the V-Safe system.   Joshua Obrien was instructed to call 911 with any severe reactions post vaccine: Marland Kitchen Difficulty breathing  . Swelling of face and throat  . A fast heartbeat  . A bad rash all over body  . Dizziness and weakness   Immunizations Administered    Name Date Dose VIS Date Route   Pfizer COVID-19 Vaccine 10/13/2019  4:17 PM 0.3 mL 07/08/2019 Intramuscular   Manufacturer: [employer], [employer]   Lot: [id number]   NDC: [address]-3

## 2019-11-07 ENCOUNTER — Ambulatory Visit: Payer: Self-pay | Attending: Internal Medicine

## 2019-11-07 DIAGNOSIS — Z23 Encounter for immunization: Secondary | ICD-10-CM

## 2019-11-07 NOTE — Progress Notes (Signed)
   Covid-19 Vaccination Clinic  Name:  Joshua Obrien    MRN: [id number] DOB: 02/21/1976  11/07/2019  [name] was observed post Covid-19 immunization for 15 minutes without incident. He was provided with Vaccine Information Sheet and instruction to access the V-Safe system.   [name] was instructed to call 911 with any severe reactions post vaccine: Marland Kitchen Difficulty breathing  . Swelling of face and throat  . A fast heartbeat  . A bad rash all over body  . Dizziness and weakness   Immunizations Administered    Name Date Dose VIS Date Route   Pfizer COVID-19 Vaccine 11/07/2019  4:52 PM 0.3 mL 07/08/2019 Intramuscular   Manufacturer: [employer], [employer]   Lot: [id number]   NDC: [address]-0

## 2020-04-20 DIAGNOSIS — L309 Dermatitis, unspecified: Secondary | ICD-10-CM | POA: Diagnosis not present

## 2020-04-20 DIAGNOSIS — Z13220 Encounter for screening for lipoid disorders: Secondary | ICD-10-CM | POA: Diagnosis not present

## 2020-04-20 DIAGNOSIS — Z0183 Encounter for blood typing: Secondary | ICD-10-CM | POA: Diagnosis not present

## 2020-04-20 DIAGNOSIS — M7989 Other specified soft tissue disorders: Secondary | ICD-10-CM | POA: Diagnosis not present

## 2020-04-20 DIAGNOSIS — J4540 Moderate persistent asthma, uncomplicated: Secondary | ICD-10-CM | POA: Diagnosis not present

## 2020-04-20 DIAGNOSIS — Z0000 Encounter for general adult medical examination without abnormal findings: Secondary | ICD-10-CM | POA: Diagnosis not present

## 2020-04-20 DIAGNOSIS — E7800 Pure hypercholesterolemia, unspecified: Secondary | ICD-10-CM | POA: Diagnosis not present

## 2020-04-20 DIAGNOSIS — R7301 Impaired fasting glucose: Secondary | ICD-10-CM | POA: Diagnosis not present

## 2020-04-25 ENCOUNTER — Other Ambulatory Visit: Payer: Self-pay | Admitting: Family Medicine

## 2020-04-25 ENCOUNTER — Ambulatory Visit
Admission: RE | Admit: 2020-04-25 | Discharge: 2020-04-25 | Disposition: A | Discharge status: Home / Self Care | Payer: BC Managed Care – PPO | Source: Ambulatory Visit | Attending: Family Medicine | Admitting: Family Medicine

## 2020-04-25 DIAGNOSIS — M7989 Other specified soft tissue disorders: Secondary | ICD-10-CM

## 2020-04-25 DIAGNOSIS — M19071 Primary osteoarthritis, right ankle and foot: Secondary | ICD-10-CM | POA: Diagnosis not present

## 2020-04-25 DIAGNOSIS — S99921A Unspecified injury of right foot, initial encounter: Secondary | ICD-10-CM | POA: Diagnosis not present

## 2020-05-30 DIAGNOSIS — Z01818 Encounter for other preprocedural examination: Secondary | ICD-10-CM | POA: Diagnosis not present

## 2020-07-26 DIAGNOSIS — Z1159 Encounter for screening for other viral diseases: Secondary | ICD-10-CM | POA: Diagnosis not present

## 2020-07-31 DIAGNOSIS — K5730 Diverticulosis of large intestine without perforation or abscess without bleeding: Secondary | ICD-10-CM | POA: Diagnosis not present

## 2020-07-31 DIAGNOSIS — D127 Benign neoplasm of rectosigmoid junction: Secondary | ICD-10-CM | POA: Diagnosis not present

## 2020-07-31 DIAGNOSIS — D123 Benign neoplasm of transverse colon: Secondary | ICD-10-CM | POA: Diagnosis not present

## 2020-07-31 DIAGNOSIS — Z1211 Encounter for screening for malignant neoplasm of colon: Secondary | ICD-10-CM | POA: Diagnosis not present

## 2020-07-31 DIAGNOSIS — D122 Benign neoplasm of ascending colon: Secondary | ICD-10-CM | POA: Diagnosis not present

## 2020-07-31 DIAGNOSIS — D124 Benign neoplasm of descending colon: Secondary | ICD-10-CM | POA: Diagnosis not present

## 2020-09-17 DIAGNOSIS — R062 Wheezing: Secondary | ICD-10-CM | POA: Diagnosis not present

## 2020-09-25 DIAGNOSIS — J45901 Unspecified asthma with (acute) exacerbation: Secondary | ICD-10-CM | POA: Diagnosis not present

## 2020-09-25 DIAGNOSIS — R079 Chest pain, unspecified: Secondary | ICD-10-CM | POA: Diagnosis not present

## 2021-04-24 DIAGNOSIS — J4540 Moderate persistent asthma, uncomplicated: Secondary | ICD-10-CM | POA: Diagnosis not present

## 2021-04-24 DIAGNOSIS — Z0000 Encounter for general adult medical examination without abnormal findings: Secondary | ICD-10-CM | POA: Diagnosis not present

## 2021-04-24 DIAGNOSIS — N529 Male erectile dysfunction, unspecified: Secondary | ICD-10-CM | POA: Diagnosis not present

## 2021-04-30 DIAGNOSIS — Z13220 Encounter for screening for lipoid disorders: Secondary | ICD-10-CM | POA: Diagnosis not present

## 2021-04-30 DIAGNOSIS — Z0000 Encounter for general adult medical examination without abnormal findings: Secondary | ICD-10-CM | POA: Diagnosis not present

## 2021-04-30 DIAGNOSIS — Z125 Encounter for screening for malignant neoplasm of prostate: Secondary | ICD-10-CM | POA: Diagnosis not present

## 2021-07-21 IMAGING — DX DG FOOT COMPLETE 3+V*R*
3 series · 3 of 3 positions shown · non-contrast
Comparison: None.

CLINICAL DATA: Dorsal foot pain, injury

EXAM:
RIGHT FOOT COMPLETE - 3+ VIEW

[dg foot complete right (1 of 3)]
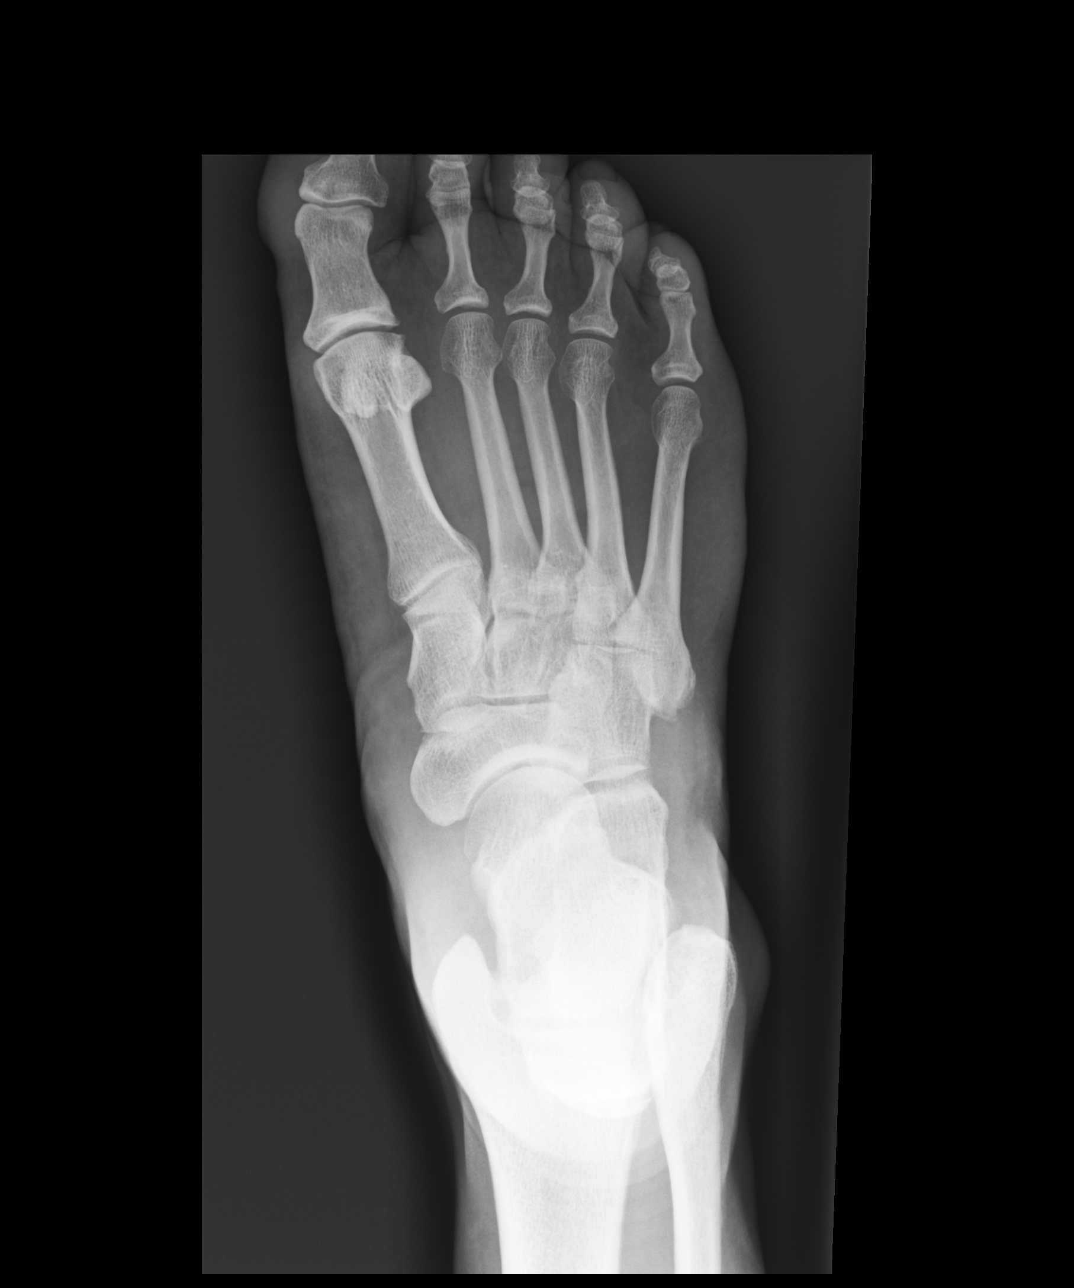

[dg foot complete right (2 of 3)]
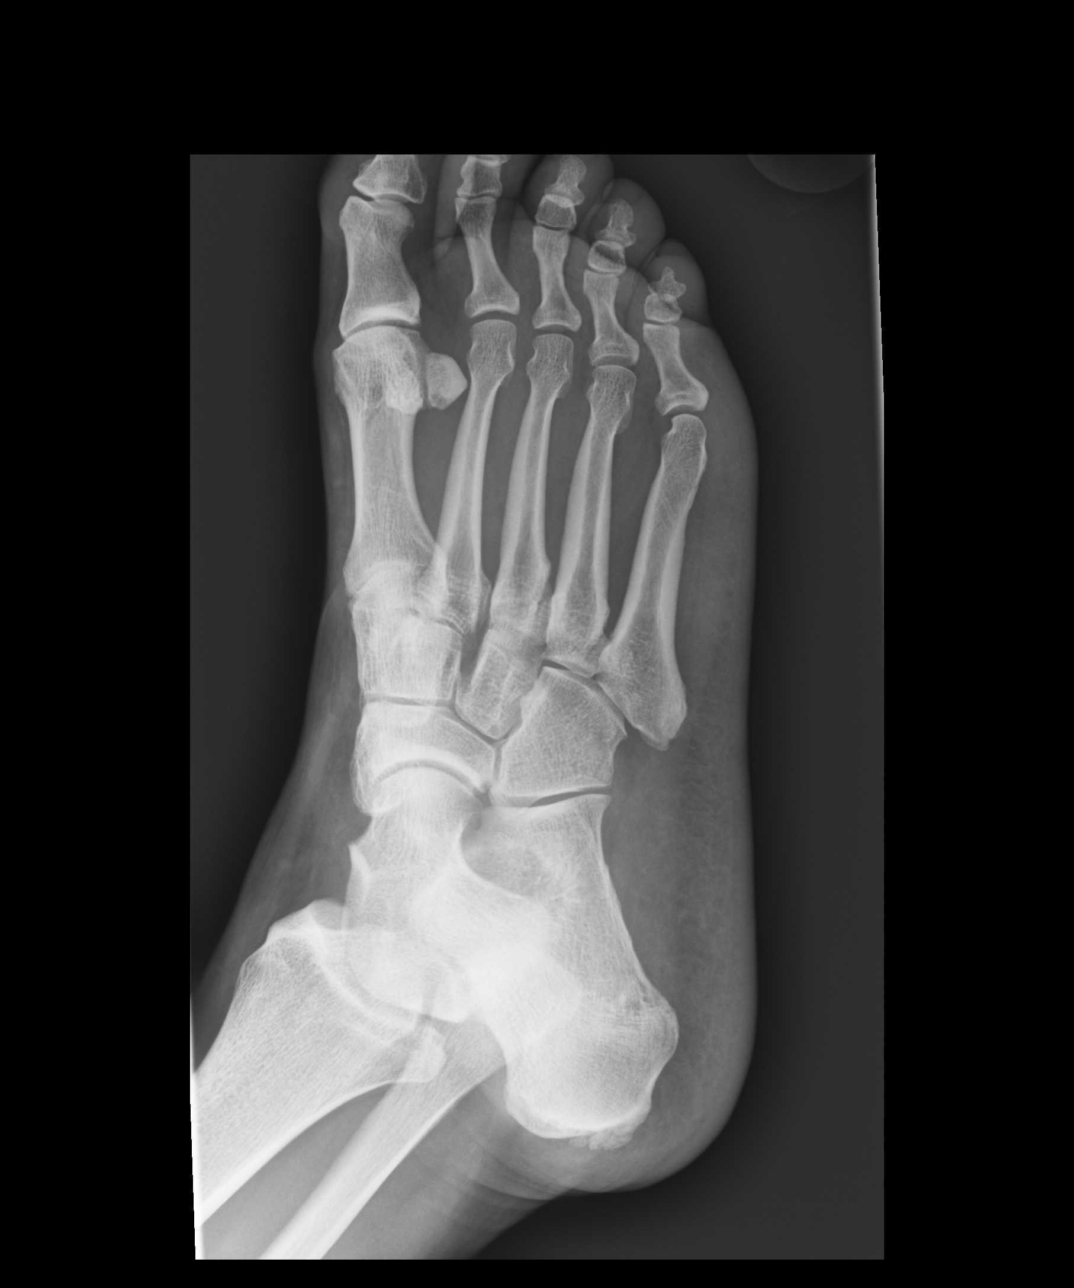

[dg foot complete right (3 of 3)]
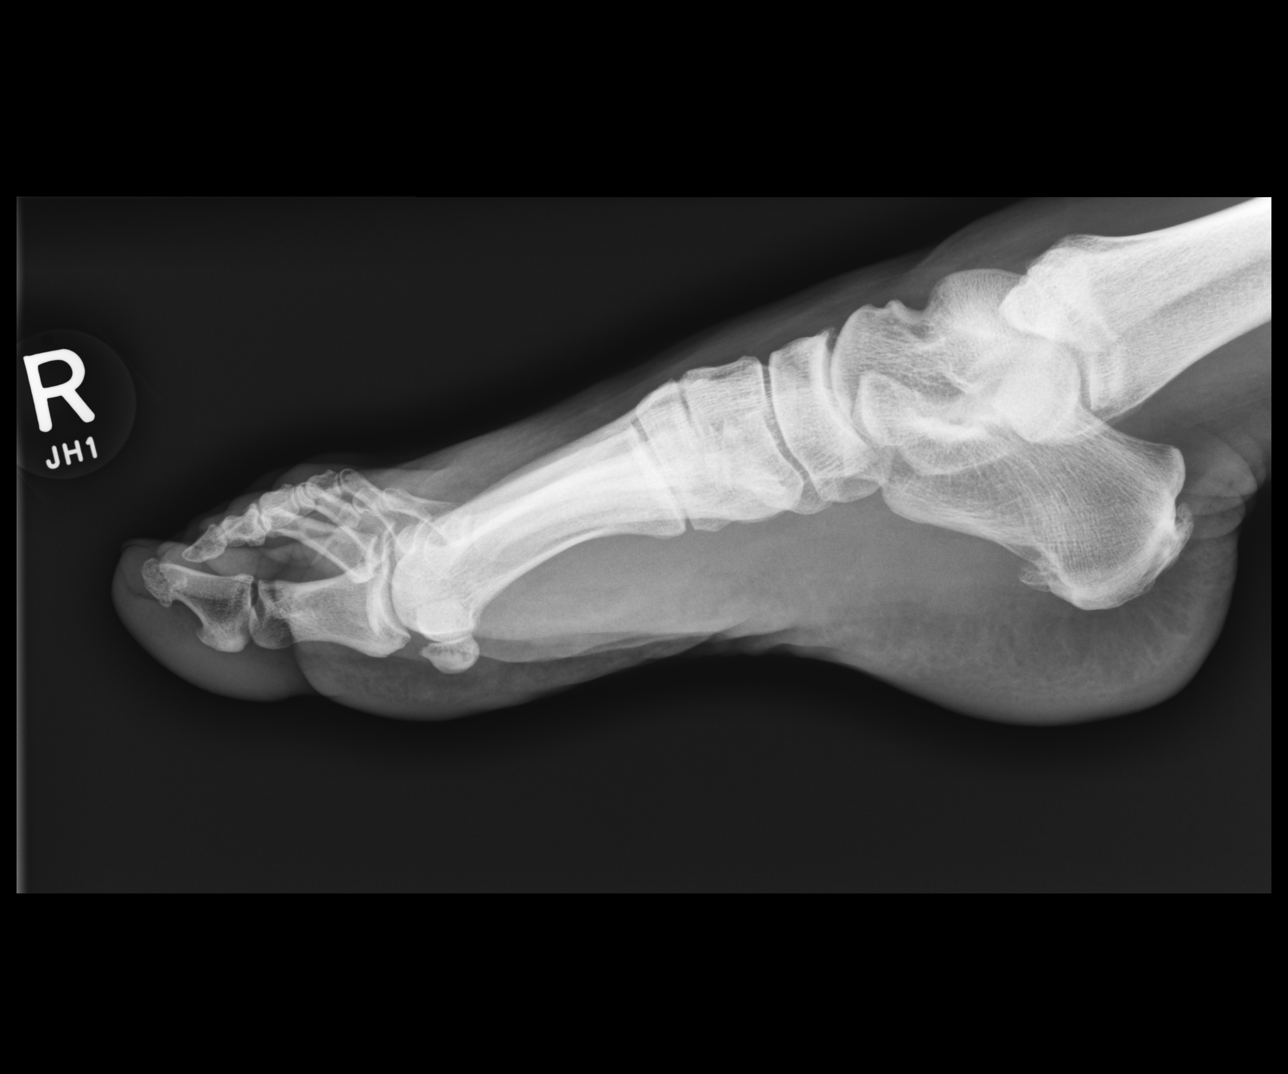

[3 of 3 positions shown; findings below may reference images not displayed]

FINDINGS: Very slight joint space loss and bony spurring of the right first
MTP joint compatible with minor degenerative change.

Normal alignment without acute osseous finding or fracture. No other
joint abnormality. Slight dorsal foot soft tissue swelling on the
lateral view.
IMPRESSION: Slight foot soft tissue swelling. Minor degenerative osteoarthritis
of the right first MTP joint.

No other acute osseous finding or malalignment.

## 2022-01-22 DIAGNOSIS — M7581 Other shoulder lesions, right shoulder: Secondary | ICD-10-CM | POA: Diagnosis not present

## 2022-01-22 DIAGNOSIS — M5412 Radiculopathy, cervical region: Secondary | ICD-10-CM | POA: Diagnosis not present

## 2022-05-07 DIAGNOSIS — Z131 Encounter for screening for diabetes mellitus: Secondary | ICD-10-CM | POA: Diagnosis not present

## 2022-05-07 DIAGNOSIS — Z79899 Other long term (current) drug therapy: Secondary | ICD-10-CM | POA: Diagnosis not present

## 2022-05-07 DIAGNOSIS — Z13220 Encounter for screening for lipoid disorders: Secondary | ICD-10-CM | POA: Diagnosis not present

## 2022-05-07 DIAGNOSIS — Z0000 Encounter for general adult medical examination without abnormal findings: Secondary | ICD-10-CM | POA: Diagnosis not present

## 2022-05-07 DIAGNOSIS — Z125 Encounter for screening for malignant neoplasm of prostate: Secondary | ICD-10-CM | POA: Diagnosis not present

## 2022-05-07 DIAGNOSIS — J4540 Moderate persistent asthma, uncomplicated: Secondary | ICD-10-CM | POA: Diagnosis not present

## 2022-05-07 DIAGNOSIS — Z1329 Encounter for screening for other suspected endocrine disorder: Secondary | ICD-10-CM | POA: Diagnosis not present
